# Patient Record
Sex: Female | Born: 1955 | Race: White | Hispanic: No | Marital: Single | Smoking: Current every day smoker
Health system: Southern US, Community
[De-identification: ages and names within clinical notes are randomized; demographics above are authoritative.]

## PROBLEM LIST (undated history)

## (undated) DIAGNOSIS — Z87898 Personal history of other specified conditions: Secondary | ICD-10-CM

## (undated) HISTORY — DX: Personal history of other specified conditions: Z87.898

## (undated) HISTORY — PX: ROTATOR CUFF REPAIR: SHX139

## (undated) HISTORY — PX: TOTAL HIP ARTHROPLASTY: SHX124

## (undated) HISTORY — PX: WRIST FRACTURE SURGERY: SHX121

---

## 2015-06-10 ENCOUNTER — Emergency Department: Payer: BLUE CROSS/BLUE SHIELD

## 2015-06-10 ENCOUNTER — Emergency Department (INDEPENDENT_AMBULATORY_CARE_PROVIDER_SITE_OTHER): Payer: BLUE CROSS/BLUE SHIELD

## 2015-06-10 ENCOUNTER — Encounter: Payer: Self-pay | Admitting: *Deleted

## 2015-06-10 ENCOUNTER — Ambulatory Visit (INDEPENDENT_AMBULATORY_CARE_PROVIDER_SITE_OTHER): Payer: BLUE CROSS/BLUE SHIELD | Admitting: Family Medicine

## 2015-06-10 ENCOUNTER — Emergency Department (INDEPENDENT_AMBULATORY_CARE_PROVIDER_SITE_OTHER)
Admission: EM | Admit: 2015-06-10 | Discharge: 2015-06-10 | Disposition: A | Discharge status: Home / Self Care | Payer: BLUE CROSS/BLUE SHIELD | Source: Home / Self Care | Attending: Family Medicine | Admitting: Family Medicine

## 2015-06-10 DIAGNOSIS — M25562 Pain in left knee: Secondary | ICD-10-CM | POA: Diagnosis not present

## 2015-06-10 DIAGNOSIS — M7122 Synovial cyst of popliteal space [Baker], left knee: Secondary | ICD-10-CM

## 2015-06-10 MED ORDER — TRAMADOL HCL 50 MG PO TABS: 50.0000 mg | ORAL_TABLET | Freq: Three times a day (TID) | ORAL | Status: AC | PRN

## 2015-06-10 NOTE — ED Notes (Addendum)
Pt to return for US venous doppler at 11:30am in [place]. Pt will return home now to elevate her leg per Dr Cathren Harsh request until her appt.  Clemens Catholic, LPN

## 2015-06-10 NOTE — ED Notes (Signed)
Pt c/o pain and swelling to the back of her LT calf x 2 wks. Denies injury. She has taken IBF and Goody powders without relief.

## 2015-06-10 NOTE — ED Provider Notes (Signed)
CSN: [id number]     Arrival date & time 06/10/15  7829 History   First MD Initiated Contact with Patient 06/10/15 [phone]     Chief Complaint  Patient presents with  . Leg Pain      HPI Comments: Patient complains of onset of pain in her left posterior calf about two weeks ago.  The pain has gradually extended upwards to involve her knee and lower left posterior thigh.  She recalls no injury or change in physical activities.  No chest pain or shortness of breath.  She smokes one pack per day cigarettes. No history of DVT.  Patient is a 60 y.o. female presenting with leg pain. The history is provided by the patient.  Leg Pain Location:  Leg Time since incident:  2 weeks Injury: no   Leg location:  L lower leg and L upper leg Pain details:    Quality:  Aching   Radiates to:  Does not radiate   Severity:  Moderate   Onset quality:  Gradual   Duration:  2 weeks   Timing:  Constant   Progression:  Worsening Chronicity:  New Prior injury to area:  No Relieved by:  Nothing Worsened by:  Bearing weight and flexion Ineffective treatments:  NSAIDs Associated symptoms: decreased ROM, stiffness and swelling   Associated symptoms: no fever, no muscle weakness, no numbness and no tingling     History reviewed. No pertinent past medical history. Past Surgical History  Procedure Laterality Date  . Wrist fracture surgery    . Rotator cuff repair    . Total hip arthroplasty     History reviewed. No pertinent family history. Social History  Substance Use Topics  . Smoking status: Current Every Day Smoker -- 1.00 packs/day    Types: Cigarettes  . Smokeless tobacco: None  . Alcohol Use: Yes     Comment: 6 q wk   OB History    No data available     Review of Systems  Constitutional: Negative for fever.  Respiratory: Negative for chest tightness, shortness of breath and wheezing.   Cardiovascular: Negative for chest pain.  Musculoskeletal: Positive for stiffness.  All other systems  reviewed and are negative.   Allergies  Review of patient's allergies indicates no known allergies.  Home Medications   Prior to Admission medications   Not on File   Meds Ordered and Administered this Visit  Medications - No data to display  BP 153/88 mmHg  Pulse 91  Temp(Src) 98 F (36.7 C) (Oral)  Resp 16  Ht  (1.575 m)  Wt 165 lb (74.844 kg)  BMI 30.17 kg/m2  SpO2 96% No data found.   Physical Exam  Constitutional: She is oriented to person, place, and time. She appears well-developed and well-nourished. No distress.  HENT:  Head: Normocephalic.  Nose: Nose normal.  Mouth/Throat: Oropharynx is clear and moist.  Eyes: Conjunctivae are normal. Pupils are equal, round, and reactive to light.  Neck: Neck supple.  Cardiovascular: Normal rate and normal heart sounds.   Pulmonary/Chest: Effort normal and breath sounds normal. She has no wheezes.  Abdominal: Bowel sounds are normal. There is no tenderness.  Musculoskeletal: She exhibits tenderness. She exhibits no edema.       Left knee: She exhibits decreased range of motion, swelling, effusion and bony tenderness. She exhibits no ecchymosis, no deformity, no erythema, normal alignment, no LCL laxity, normal patellar mobility, normal meniscus and no MCL laxity. Tenderness found. Medial joint line and  lateral joint line tenderness noted. No patellar tendon tenderness noted.       Left lower leg: She exhibits tenderness. She exhibits no bony tenderness, no swelling and no edema.       Legs: There is tenderness to palpation left posterior leg as noted on diagram.  Pedal pulses are intact.   Left knee:  No erythema or warmth.  Effusion present.  Knee stable, negative drawer test.  McMurray test negative.  There is tenderness to palpation over medial and lateral joint lines.  There is fullness and distinct tenderness to palpation in the popliteal fossa.  Lymphadenopathy:    She has no cervical adenopathy.  Neurological: She  is alert and oriented to person, place, and time.  Skin: Skin is warm and dry. No rash noted.  Nursing note and vitals reviewed.   ED Course  Procedures  None  Imaging Review US Venous Img Lower Unilateral Left  06/10/2015  CLINICAL DATA:  60 year old female with a history of left lower leg tenderness and swelling for 2 weeks EXAM: LEFT LOWER EXTREMITY VENOUS DOPPLER ULTRASOUND TECHNIQUE: Gray-scale sonography with graded compression, as well as color Doppler and duplex ultrasound were performed to evaluate the lower extremity deep venous systems from the level of the common femoral vein and including the common femoral, femoral, profunda femoral, popliteal and calf veins including the posterior tibial, peroneal and gastrocnemius veins when visible. The superficial great saphenous vein was also interrogated. Spectral Doppler was utilized to evaluate flow at rest and with distal augmentation maneuvers in the common femoral, femoral and popliteal veins. COMPARISON:  None. FINDINGS: Contralateral Common Femoral Vein: Respiratory phasicity is normal and symmetric with the symptomatic side. No evidence of thrombus. Normal compressibility. Common Femoral Vein: No evidence of thrombus. Normal compressibility, respiratory phasicity and response to augmentation. Saphenofemoral Junction: No evidence of thrombus. Normal compressibility and flow on color Doppler imaging. Profunda Femoral Vein: No evidence of thrombus. Normal compressibility and flow on color Doppler imaging. Femoral Vein: No evidence of thrombus. Normal compressibility, respiratory phasicity and response to augmentation. Popliteal Vein: No evidence of thrombus. Normal compressibility, respiratory phasicity and response to augmentation. Calf Veins: No evidence of thrombus. Normal compressibility and flow on color Doppler imaging. Superficial Great Saphenous Vein: No evidence of thrombus. Normal compressibility and flow on color Doppler imaging. Other  Findings:  None. IMPRESSION: Sonographic survey of the left lower extremity negative for DVT. Signed, Yvone Neu. Loreta Ave, DO [facility] Electronically Signed   By: Gilmer Mor D.O.   On: 06/10/2015 12:01   Dg Knee Complete 4 Views Left  06/10/2015  CLINICAL DATA:  Persistent left knee pain for 2 weeks. EXAM: LEFT KNEE - COMPLETE 4+ VIEW COMPARISON:  None. FINDINGS: Large joint effusion without fracture or subluxation. Mild marginal spurring without joint narrowing. No evidence of focal bone lesion or erosion. There is a 7 mm ossified body in the posterior medial joint line. Given distance from the joint this is likely in a Baker cyst. IMPRESSION: 1. Large joint effusion without acute osseous finding. 2. Suspect Baker cyst with intra-articular body. Electronically Signed   By: Marnee Spring M.D.   On: 06/10/2015 12:57      MDM   1. Left knee pain; no evidence DVT.  Note large joint effusion   2. Baker's cyst, left.  Note presence of intra-articular body, most likely in Baker's cyst.    Will arrange consultation with Dr. Clementeen Graham for further evaluation and management.  Lattie Haw, MD 06/10/15 1322

## 2015-06-10 NOTE — Discharge Instructions (Signed)
Baker Cyst °A Baker cyst is a sac-like structure that forms in the back of the knee. It is filled with the same fluid that is located in your knee. This fluid lubricates the bones and cartilage of the knee and allows them to move over each other more easily. °CAUSES  °When the knee becomes injured or inflamed, increased fluid forms in the knee. When this happens, the joint lining is pushed out behind the knee and forms the Baker cyst. This cyst may also be caused by inflammation from arthritic conditions and infections. °SIGNS AND SYMPTOMS  °A Baker cyst usually has no symptoms. When the cyst is substantially enlarged: °· You may feel pressure behind the knee, stiffness in the knee, or a mass in the area behind the knee. °· You may develop pain, redness, and swelling in the calf.  This can suggest a blood clot and requires evaluation by your health care provider. °DIAGNOSIS  °A Baker cyst is most often found during an ultrasound exam. This exam may have been performed for other reasons, and the cyst was found incidentally. Sometimes an MRI is used. This picks up other problems within a joint that an ultrasound exam may not. If the Baker cyst developed immediately after an injury, X-ray exams may be used to diagnose the cyst. °TREATMENT  °The treatment depends on the cause of the cyst. Anti-inflammatory medicines and rest often will be prescribed. If the cyst is caused by a bacterial infection, antibiotic medicines may be prescribed.  °HOME CARE INSTRUCTIONS  °· If the cyst was caused by an injury, for the first 24 hours, keep the injured leg elevated on 2 pillows while lying down. °· For the first 24 hours while you are awake, apply ice to the injured area: °¨ Put ice in a plastic bag. °¨ Place a towel between your skin and the bag. °¨ Leave the ice on for 20 minutes, 2-3 times a day. °· Only take over-the-counter or prescription medicines for pain, discomfort, or fever as directed by your health care  provider. °· Only take antibiotic medicine as directed. Make sure to finish it even if you start to feel better. °MAKE SURE YOU:  °· Understand these instructions. °· Will watch your condition. °· Will get help right away if you are not doing well or get worse. °  °This information is not intended to replace advice given to you by your health care provider. Make sure you discuss any questions you have with your health care provider. °  °Document Released: 04/13/2005 Document Revised: 02/01/2013 Document Reviewed: 11/23/2012 °Elsevier Interactive Patient Education ©2016 [employer] ° °

## 2015-06-10 NOTE — Assessment & Plan Note (Signed)
Effusion and loose body. I'm not sure how the loose body relates to patient's knee pain and swelling. She denies any mechanical symptoms. Trial of injection today. Treat with tramadol additionally. Culture cell differential count and crystal analysis pending of knee fluid. Follow-up in 1-3 weeks.

## 2015-06-10 NOTE — Progress Notes (Signed)
Subjective:    I'm seeing this patient as a consultation for:  Dr Cathren Harsh  CC: Left knee pain and swelling  HPI: Left knee pain and swelling for about 2 weeks with no specific injury. No locking catching or giving way. Pain is worse with activity and better with rest. No radiating pain weakness or numbness fevers or chills. Patient was seen by urgent care today who diagnosed knee effusion and loose body. Patient is interested in aspiration and injection.  Past medical history, Surgical history, Family history not pertinant except as noted below, Social history, Allergies, and medications have been entered into the medical record, reviewed, and no changes needed.   Review of Systems: No headache, visual changes, nausea, vomiting, diarrhea, constipation, dizziness, abdominal pain, skin rash, fevers, chills, night sweats, weight loss, swollen lymph nodes, body aches, joint swelling, muscle aches, chest pain, shortness of breath, mood changes, visual or auditory hallucinations.   Objective:   BP 153/88 mmHg  Pulse 91  Temp(Src) 98 F (36.7 C) (Oral)  Resp 16  Ht  (1.575 m)  Wt 165 lb (74.844 kg)  BMI 30.17 kg/m2  SpO2 96% General: Well Developed, well nourished, and in no acute distress.  Neuro/Psych: Alert and oriented x3, extra-ocular muscles intact, able to move all 4 extremities, sensation grossly intact. Skin: Warm and dry, no rashes noted.  Respiratory: Not using accessory muscles, speaking in full sentences, trachea midline.  Cardiovascular: Pulses palpable, no extremity edema. Abdomen: Does not appear distended. MSK: Left knee with moderate effusion. No ecchymosis. Range of motion 0-120 with 1+ retropatellar crepitations. Nontender joint line. Stable ligamentous exam testing.  Procedure: Real-time Ultrasound Guided aspiration and injection of left knee  Device: GE Logiq E  Images permanently stored and available for review in the ultrasound unit. Verbal informed consent  obtained. Discussed risks and benefits of procedure. Warned about infection bleeding damage to structures skin hypopigmentation and fat atrophy among others. Patient expresses understanding and agreement Time-out conducted.  Noted no overlying erythema, induration, or other signs of local infection.  Skin prepped in a sterile fashion.  Local anesthesia: Topical Ethyl chloride.  3 mL of lidocaine without epinephrine injected subcutaneously achieving good anesthesia along the planned aspiration tract With sterile technique and under real time ultrasound guidance: 18 gauge needle under ultrasound guidance used to access the suprapatellar space. 25 mL of cloudy fluid removed. Syringe exchanged and 40 mg of Kenalog and 4 ml of Marcaine injected easily.  Completed without difficulty  Pain immediately resolved suggesting accurate placement of the medication.  Advised to call if fevers/chills, erythema, induration, drainage, or persistent bleeding.  Images permanently stored and available for review in the ultrasound unit.  Impression: Technically successful ultrasound guided injection.    No results found for this or any previous visit (from the past 24 hour(s)). US Venous Img Lower Unilateral Left  06/10/2015  CLINICAL DATA:  59 year old female with a history of left lower leg tenderness and swelling for 2 weeks EXAM: LEFT LOWER EXTREMITY VENOUS DOPPLER ULTRASOUND TECHNIQUE: Gray-scale sonography with graded compression, as well as color Doppler and duplex ultrasound were performed to evaluate the lower extremity deep venous systems from the level of the common femoral vein and including the common femoral, femoral, profunda femoral, popliteal and calf veins including the posterior tibial, peroneal and gastrocnemius veins when visible. The superficial great saphenous vein was also interrogated. Spectral Doppler was utilized to evaluate flow at rest and with distal augmentation maneuvers in the  common femoral,  femoral and popliteal veins. COMPARISON:  None. FINDINGS: Contralateral Common Femoral Vein: Respiratory phasicity is normal and symmetric with the symptomatic side. No evidence of thrombus. Normal compressibility. Common Femoral Vein: No evidence of thrombus. Normal compressibility, respiratory phasicity and response to augmentation. Saphenofemoral Junction: No evidence of thrombus. Normal compressibility and flow on color Doppler imaging. Profunda Femoral Vein: No evidence of thrombus. Normal compressibility and flow on color Doppler imaging. Femoral Vein: No evidence of thrombus. Normal compressibility, respiratory phasicity and response to augmentation. Popliteal Vein: No evidence of thrombus. Normal compressibility, respiratory phasicity and response to augmentation. Calf Veins: No evidence of thrombus. Normal compressibility and flow on color Doppler imaging. Superficial Great Saphenous Vein: No evidence of thrombus. Normal compressibility and flow on color Doppler imaging. Other Findings:  None. IMPRESSION: Sonographic survey of the left lower extremity negative for DVT. Signed, Yvone Neu. Loreta Ave, DO [facility] Electronically Signed   By: Gilmer Mor D.O.   On: 06/10/2015 12:01   Dg Knee Complete 4 Views Left  06/10/2015  CLINICAL DATA:  Persistent left knee pain for 2 weeks. EXAM: LEFT KNEE - COMPLETE 4+ VIEW COMPARISON:  None. FINDINGS: Large joint effusion without fracture or subluxation. Mild marginal spurring without joint narrowing. No evidence of focal bone lesion or erosion. There is a 7 mm ossified body in the posterior medial joint line. Given distance from the joint this is likely in a Baker cyst. IMPRESSION: 1. Large joint effusion without acute osseous finding. 2. Suspect Baker cyst with intra-articular body. Electronically Signed   By: Marnee Spring M.D.   On: 06/10/2015 12:57    Impression and Recommendations:     This case required medical decision making of moderate complexity.

## 2015-06-10 NOTE — Patient Instructions (Signed)
Thank you for coming in today. Call or go to the ER if you develop a large red swollen joint with extreme pain or oozing puss.  Return in 1-3 weeks for follow up.

## 2015-06-11 LAB — SYNOVIAL CELL COUNT + DIFF, W/ CRYSTALS
Crystals, Fluid: NONE SEEN
Eosinophils-Synovial: 0 % (ref 0–1)
LYMPHOCYTES-SYNOVIAL FLD: 9 % (ref 0–20)
MONOCYTE/MACROPHAGE: 45 % — AB (ref 50–90)
Neutrophil, Synovial: 46 % — ABNORMAL HIGH (ref 0–25)
WBC, SYNOVIAL: 145 uL (ref 0–200)

## 2015-06-12 NOTE — Progress Notes (Signed)
Quick Note:  Knee fluid is normal appearing so far. Awaiting culture results. ______

## 2015-08-06 DIAGNOSIS — M1712 Unilateral primary osteoarthritis, left knee: Secondary | ICD-10-CM | POA: Diagnosis not present

## 2015-09-05 DIAGNOSIS — M1712 Unilateral primary osteoarthritis, left knee: Secondary | ICD-10-CM | POA: Diagnosis not present

## 2015-11-01 DIAGNOSIS — Z862 Personal history of diseases of the blood and blood-forming organs and certain disorders involving the immune mechanism: Secondary | ICD-10-CM | POA: Diagnosis not present

## 2015-11-01 DIAGNOSIS — R7302 Impaired glucose tolerance (oral): Secondary | ICD-10-CM | POA: Diagnosis not present

## 2015-11-01 DIAGNOSIS — J019 Acute sinusitis, unspecified: Secondary | ICD-10-CM | POA: Diagnosis not present

## 2015-11-12 DIAGNOSIS — R05 Cough: Secondary | ICD-10-CM | POA: Diagnosis not present

## 2015-11-12 DIAGNOSIS — J329 Chronic sinusitis, unspecified: Secondary | ICD-10-CM | POA: Diagnosis not present

## 2015-11-12 DIAGNOSIS — K219 Gastro-esophageal reflux disease without esophagitis: Secondary | ICD-10-CM | POA: Diagnosis not present

## 2015-11-12 DIAGNOSIS — J40 Bronchitis, not specified as acute or chronic: Secondary | ICD-10-CM | POA: Diagnosis not present

## 2016-11-04 ENCOUNTER — Encounter: Payer: Self-pay | Admitting: Osteopathic Medicine

## 2016-11-04 ENCOUNTER — Ambulatory Visit (INDEPENDENT_AMBULATORY_CARE_PROVIDER_SITE_OTHER): Payer: BLUE CROSS/BLUE SHIELD | Admitting: Osteopathic Medicine

## 2016-11-04 VITALS — BP 145/57 | HR 77 | Wt 171.0 lb

## 2016-11-04 DIAGNOSIS — Z87898 Personal history of other specified conditions: Secondary | ICD-10-CM

## 2016-11-04 DIAGNOSIS — M25531 Pain in right wrist: Secondary | ICD-10-CM | POA: Diagnosis not present

## 2016-11-04 DIAGNOSIS — R42 Dizziness and giddiness: Secondary | ICD-10-CM

## 2016-11-04 LAB — EKG 12-LEAD

## 2016-11-04 MED ORDER — MELOXICAM 15 MG PO TABS: 15.0000 mg | ORAL_TABLET | Freq: Every day | ORAL | 0 refills | Status: DC

## 2016-11-04 NOTE — Patient Instructions (Addendum)
Plan: Wear wrist brace as much as possible during the day - take antiinflammatories x1 week daily then as needed, follow-up with Dr Denyse Amassorey if no better  Will plan to come see me in 1-2 weeks to recheck symptoms and review labs in detail    Orthostatic Hypotension Orthostatic hypotension is a sudden drop in blood pressure that happens when you quickly change positions, such as when you get up from a seated or lying position. Blood pressure is a measurement of how strongly, or weakly, your blood is pressing against the walls of your arteries. Arteries are blood vessels that carry blood from your heart throughout your body. When blood pressure is too low, you may not get enough blood to your brain or to the rest of your organs. This can cause weakness, light-headedness, rapid heartbeat, and fainting. This can last for just a few seconds or for up to a few minutes. Orthostatic hypotension is usually not a serious problem. However, if it happens frequently or gets worse, it may be a sign of something more serious. What are the causes? This condition may be caused by:  Sudden changes in posture, such as standing up quickly after you have been sitting or lying down.  Blood loss.  Loss of body fluids (dehydration).  Heart problems.  Hormone (endocrine) problems.  Pregnancy.  Severe infection.  Lack of certain nutrients.  Severe allergic reactions (anaphylaxis).  Certain medicines, such as blood pressure medicine or medicines that make the body lose excess fluids (diuretics). Sometimes, this condition can be caused by not taking medicine as directed, such as taking too much of a certain medicine.  What increases the risk? Certain factors can make you more likely to develop orthostatic hypotension, including:  Age. Risk increases as you get older.  Conditions that affect the heart or the central nervous system.  Taking certain medicines, such as blood pressure medicine or  diuretics.  Being pregnant.  What are the signs or symptoms? Symptoms of this condition may include:  Weakness.  Light-headedness.  Dizziness.  Blurred vision.  Fatigue.  Rapid heartbeat.  Fainting, in severe cases.  How is this diagnosed? This condition is diagnosed based on:  Your medical history.  Your symptoms.  Your blood pressure measurement. Your health care provider will check your blood pressure when you are: ? Lying down. ? Sitting. ? Standing.  A blood pressure reading is recorded as two numbers, such as "120 over 80" (or 120/80). The first ("top") number is called the systolic pressure. It is a measure of the pressure in your arteries as your heart beats. The second ("bottom") number is called the diastolic pressure. It is a measure of the pressure in your arteries when your heart relaxes between beats. Blood pressure is measured in a unit called mm Hg. Healthy blood pressure for adults is 120/80. If your blood pressure is below 90/60, you may be diagnosed with hypotension. Other information or tests that may be used to diagnose orthostatic hypotension include:  Your other vital signs, such as your heart rate and temperature.  Blood tests.  Tilt table test. For this test, you will be safely secured to a table that moves you from a lying position to an upright position. Your heart rhythm and blood pressure will be monitored during the test.  How is this treated? Treatment for this condition may include:  Changing your diet. This may involve eating more salt (sodium) or drinking more water.  Taking medicines to raise your blood pressure.  Changing the dosage of certain medicines you are taking that might be lowering your blood pressure.  Wearing compression stockings. These stockings help to prevent blood clots and reduce swelling in your legs.  In some cases, you may need to go to the hospital for:  Fluid replacement. This means you will receive  fluids through an IV tube.  Blood replacement. This means you will receive donated blood through an IV tube (transfusion).  Treating an infection or heart problems, if this applies.  Monitoring. You may need to be monitored while medicines that you are taking wear off.  Follow these instructions at home: Eating and drinking   Drink enough fluid to keep your urine clear or pale yellow.  Eat a healthy diet and follow instructions from your health care provider about eating or drinking restrictions. A healthy diet includes: ? Fresh fruits and vegetables. ? Whole grains. ? Lean meats. ? Low-fat dairy products.  Eat extra salt only as directed. Do not add extra salt to your diet unless your health care provider told you to do that.  Eat frequent, small meals.  Avoid standing up suddenly after eating. Medicines  Take over-the-counter and prescription medicines only as told by your health care provider. ? Follow instructions from your health care provider about changing the dosage of your current medicines, if this applies. ? Do not stop or adjust any of your medicines on your own. General instructions  Wear compression stockings as told by your health care provider.  Get up slowly from lying down or sitting positions. This gives your blood pressure a chance to adjust.  Avoid hot showers and excessive heat as directed by your health care provider.  Return to your normal activities as told by your health care provider. Ask your health care provider what activities are safe for you.  Do not use any products that contain nicotine or tobacco, such as cigarettes and e-cigarettes. If you need help quitting, ask your health care provider.  Keep all follow-up visits as told by your health care provider. This is important. Contact a health care provider if:  You vomit.  You have diarrhea.  You have a fever for more than 2-3 days.  You feel more thirsty than usual.  You feel weak  and tired. Get help right away if:  You have chest pain.  You have a fast or irregular heartbeat.  You develop numbness in any part of your body.  You cannot move your arms or your legs.  You have trouble speaking.  You become sweaty or feel lightheaded.  You faint.  You feel short of breath.  You have trouble staying awake.  You feel confused. This information is not intended to replace advice given to you by your health care provider. Make sure you discuss any questions you have with your health care provider. Document Released: 04/03/2002 Document Revised: 12/31/2015 Document Reviewed: 10/04/2015 Elsevier Interactive Patient Education  2018 [employer]

## 2016-11-05 DIAGNOSIS — Z87898 Personal history of other specified conditions: Secondary | ICD-10-CM | POA: Insufficient documentation

## 2016-11-05 DIAGNOSIS — R42 Dizziness and giddiness: Secondary | ICD-10-CM | POA: Insufficient documentation

## 2016-11-05 DIAGNOSIS — M25531 Pain in right wrist: Secondary | ICD-10-CM | POA: Insufficient documentation

## 2016-11-05 LAB — CBC
HCT: 46.2 % — ABNORMAL HIGH (ref 35.0–45.0)
Hemoglobin: 15.3 g/dL (ref 11.7–15.5)
MCH: 30.8 pg (ref 27.0–33.0)
MCHC: 33.1 g/dL (ref 32.0–36.0)
MCV: 93.1 fL (ref 80.0–100.0)
MPV: 10.6 fL (ref 7.5–12.5)
PLATELETS: 241 10*3/uL (ref 140–400)
RBC: 4.96 MIL/uL (ref 3.80–5.10)
RDW: 14.1 % (ref 11.0–15.0)
WBC: 6.7 10*3/uL (ref 3.8–10.8)

## 2016-11-05 LAB — TSH: TSH: 0.53 mIU/L

## 2016-11-05 LAB — COMPLETE METABOLIC PANEL WITH GFR
ALT: 12 U/L (ref 6–29)
AST: 12 U/L (ref 10–35)
Albumin: 3.8 g/dL (ref 3.6–5.1)
Alkaline Phosphatase: 30 U/L — ABNORMAL LOW (ref 33–130)
BILIRUBIN TOTAL: 0.5 mg/dL (ref 0.2–1.2)
BUN: 15 mg/dL (ref 7–25)
CHLORIDE: 106 mmol/L (ref 98–110)
CO2: 25 mmol/L (ref 20–31)
Calcium: 9.1 mg/dL (ref 8.6–10.4)
Creat: 0.69 mg/dL (ref 0.50–1.05)
GLUCOSE: 75 mg/dL (ref 65–99)
Potassium: 4.7 mmol/L (ref 3.5–5.3)
SODIUM: 139 mmol/L (ref 135–146)
TOTAL PROTEIN: 6 g/dL — AB (ref 6.1–8.1)

## 2016-11-05 NOTE — Progress Notes (Signed)
HPI: Mackenzie Wilson is a 61 y.o. female  who presents to [facility] today, 11/05/16,  for chief complaint of:  Chief Complaint  Patient presents with  . Establish Care    New patient here to establish care. Has been several years since routine labs are checkup. She is concerned about sugar levels. She states that she has been feeling a bit more jittery lately. History of prediabetes.  Lightheadedness: Occasionally bothersome, only happens when changing position from seated to standing. She had one episode of syncope with this see months ago, nothing before or since. No palpitations, no chest pain, no trouble breathing. No vision change, no history of migraine or seizure.  Right wrist pain: Associated with some numbness/tingling in digits 2, 3, 4. Some pain with thumb movement. Does not radiate up the arm. Reports some decreased grip strength on right hand compared to left. Has been wearing carpal tunnel brace but this seems to fall off at night. Only wearing it at night.   Past medical, surgical, social and family history reviewed: Patient Active Problem List   Diagnosis Date Noted  . Left knee pain 06/10/2015   Past Surgical History:  Procedure Laterality Date  . ROTATOR CUFF REPAIR    . TOTAL HIP ARTHROPLASTY    . WRIST FRACTURE SURGERY     Social History  Substance Use Topics  . Smoking status: Current Every Day Smoker    Packs/day: 1.00    Types: Cigarettes  . Smokeless tobacco: Never Used  . Alcohol use Yes     Comment: 6 q wk   No family history on file.   Current medication list and allergy/intolerance information reviewed:   Current Outpatient Prescriptions  Medication Sig Dispense Refill  . Calcium Carb-Cholecalciferol (CALCIUM 1000 + D PO) Take by mouth.    . meloxicam (MOBIC) 15 MG tablet Take 1 tablet (15 mg total) by mouth daily. 30 tablet 0  . traMADol (ULTRAM) 50 MG tablet Take 1 tablet (50 mg total) by mouth every 8 (eight)  hours as needed. 30 tablet 0   No current facility-administered medications for this visit.    No Known Allergies    Review of Systems:  Constitutional:  No  fever, no chills, No recent illness, No unintentional weight changes. No significant fatigue.   HEENT: +occasional headache, no vision change, no hearing change, No sore throat, No  sinus pressure  Cardiac: No  chest pain, No  pressure, No palpitations, No  Orthopnea  Respiratory:  No  shortness of breath. No  Cough  Gastrointestinal: No  abdominal pain, No  nausea, No  vomiting,  No  blood in stool, No  diarrhea, No  constipation   Musculoskeletal: No new myalgia/arthralgia  Genitourinary: No  incontinence, No  abnormal genital bleeding, No abnormal genital discharge  Skin: No  Rash, No other wounds/concerning lesions  Endocrine: No cold intolerance,  No heat intolerance. No polyuria/polydipsia/polyphagia   Neurologic: No  weakness, No  dizziness, No  slurred speech/focal weakness/facial droop  Psychiatric: No  concerns with depression, No  concerns with anxiety, No sleep problems, No mood problems  Exam:  BP (!) 145/57   Pulse 77   Wt 171 lb (77.6 kg)   BMI 31.28 kg/m   Constitutional: VS see above. General Appearance: alert, well-developed, well-nourished, NAD  Eyes: Normal lids and conjunctive, non-icteric sclera  Ears, Nose, Mouth, Throat: MMM, Normal external inspection ears/nares/mouth/lips/gums.   Neck: No masses, trachea midline. No thyroid enlargement. No  tenderness/mass appreciated. No lymphadenopathy  Respiratory: Normal respiratory effort. no wheeze, no rhonchi, no rales  Cardiovascular: S1/S2 normal, no murmur, no rub/gallop auscultated. RRR. No lower extremity edema.  Musculoskeletal: Gait normal. No clubbing/cyanosis of digits. Neg tinel's/phalens on R but some decreased grip strength ?pain vs weakness  Neurological: Normal balance/coordination. No tremor.   Skin: warm, dry,  intact.  Psychiatric: Normal judgment/insight. Normal mood and affect. Oriented x3.   No data found.    ASSESSMENT/PLAN:   Orthostatic lightheaded - Orthostatic vital signs show drop systolic blood pressure 10 points from lying down to standing. No concerning cardio/neuro symptoms but consider further w/u with echocardiogram, carotid ultrasound, consider CT versus MRI. Additional patient information printed.- Plan: CBC, COMPLETE METABOLIC PANEL WITH GFR, TSH, Hemoglobin A1c, VITAMIN D 25 Hydroxy (Vit-D Deficiency, Fractures), Vitamin B12, Urinalysis, Routine w reflex microscopic  Right wrist pain - We'll try some spica splint for immobilization of thumb as well since this seems to be bothering her, consider follow-up with sports medicine   History of prediabetes - Plan: CBC, COMPLETE METABOLIC PANEL WITH GFR, Hemoglobin A1c    Patient Instructions  Plan: Wear wrist brace as much as possible during the day - take antiinflammatories x1 week daily then as needed, follow-up with Dr Denyse Amassorey if no better  Will plan to come see me in 1-2 weeks to recheck symptoms and review labs in detail        Visit summary with medication list and pertinent instructions was printed for patient to review. All questions at time of visit were answered - patient instructed to contact office with any additional concerns. ER/RTC precautions were reviewed with the patient. Follow-up plan: Return for recheck and review labs 1-2 weeks.

## 2016-11-06 LAB — URINALYSIS, ROUTINE W REFLEX MICROSCOPIC
BILIRUBIN URINE: NEGATIVE
Glucose, UA: NEGATIVE
HGB URINE DIPSTICK: NEGATIVE
KETONES UR: NEGATIVE
Leukocytes, UA: NEGATIVE
NITRITE: NEGATIVE
PROTEIN: NEGATIVE
Specific Gravity, Urine: 1.012 (ref 1.001–1.035)
pH: 7.5 (ref 5.0–8.0)

## 2016-11-06 LAB — VITAMIN B12: VITAMIN B 12: 574 pg/mL (ref 200–1100)

## 2016-11-06 LAB — HEMOGLOBIN A1C
HEMOGLOBIN A1C: 5.5 % (ref <5.7)
MEAN PLASMA GLUCOSE: 111 mg/dL

## 2016-11-06 LAB — VITAMIN D 25 HYDROXY (VIT D DEFICIENCY, FRACTURES): VIT D 25 HYDROXY: 30 ng/mL (ref 30–100)

## 2016-11-19 NOTE — Addendum Note (Signed)
Addended by: Baird KayUGLAS, Kalasia Crafton M on: 11/19/2016 11:57 AM   Modules accepted: Orders

## 2017-01-29 IMAGING — US US EXTREM LOW VENOUS*L*
1 series · 13 of 24 positions shown · non-contrast
Comparison: None.

CLINICAL DATA: 59-year-old female with a history of left lower leg
tenderness and swelling for 2 weeks



[Series 1: us extrem low venous*left* · 0.07mm/px · 13 of 28 slices shown]
[im 1/28]
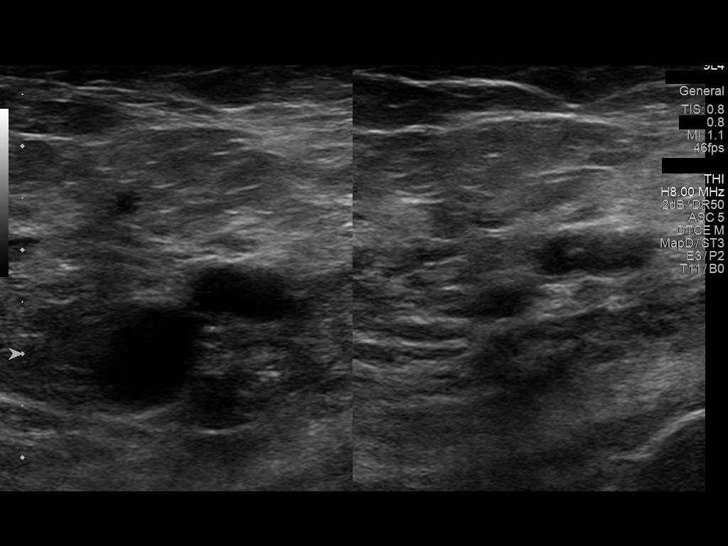
[im 3/28]
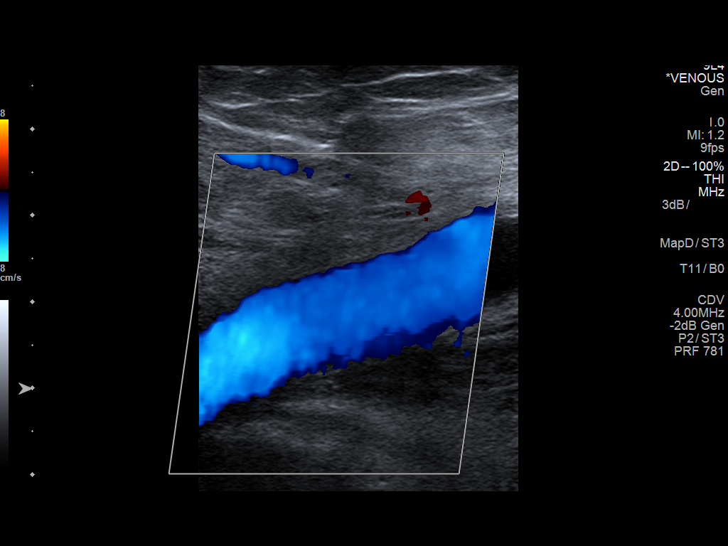
[im 5/28]
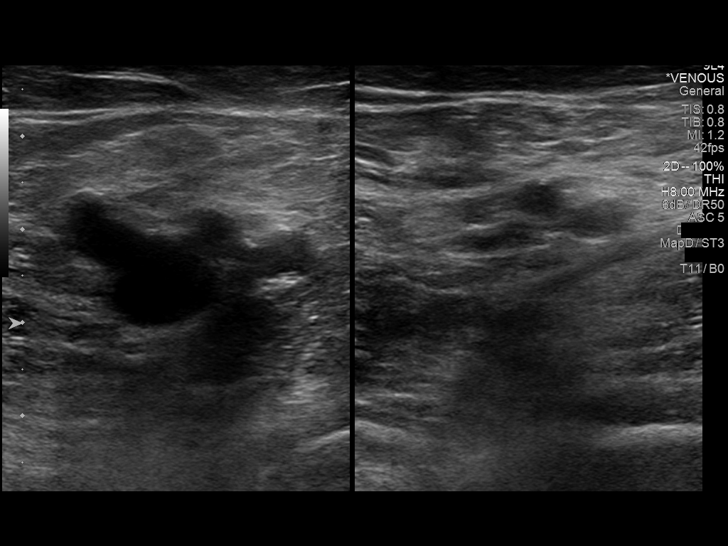
[im 8/28]
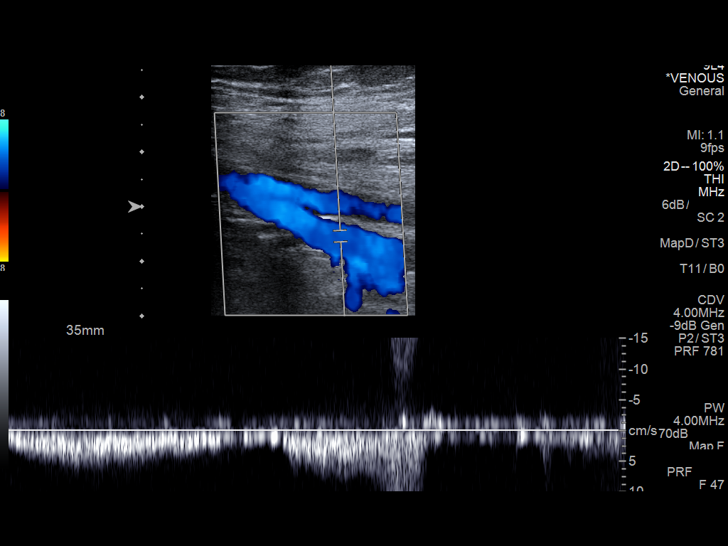
[im 10/28]
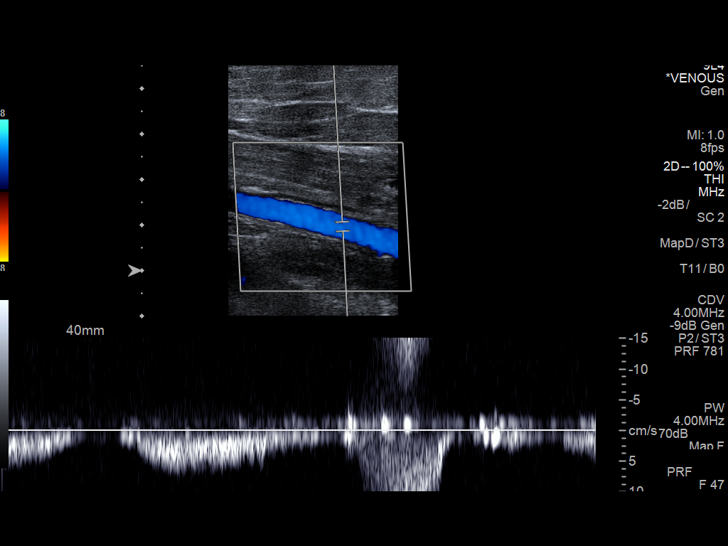
[im 12/28]
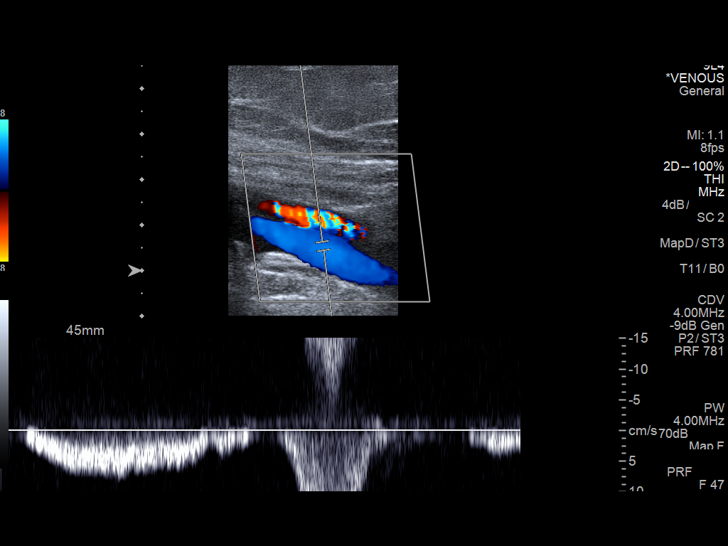
[im 15/28]
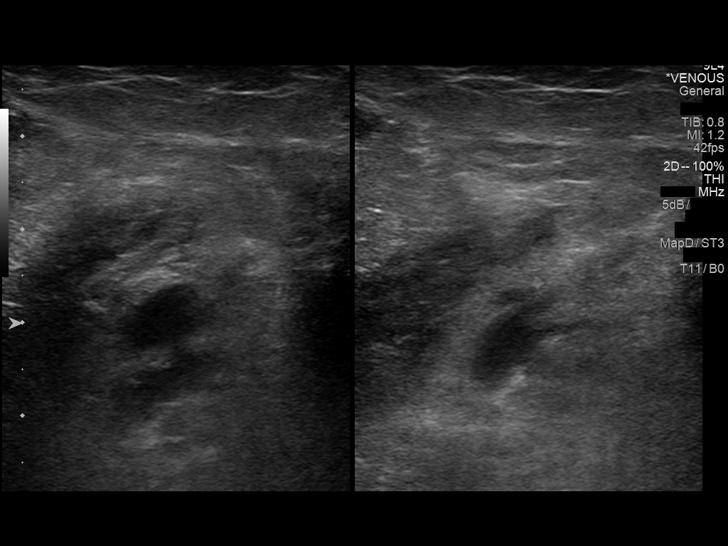
[im 16/28]
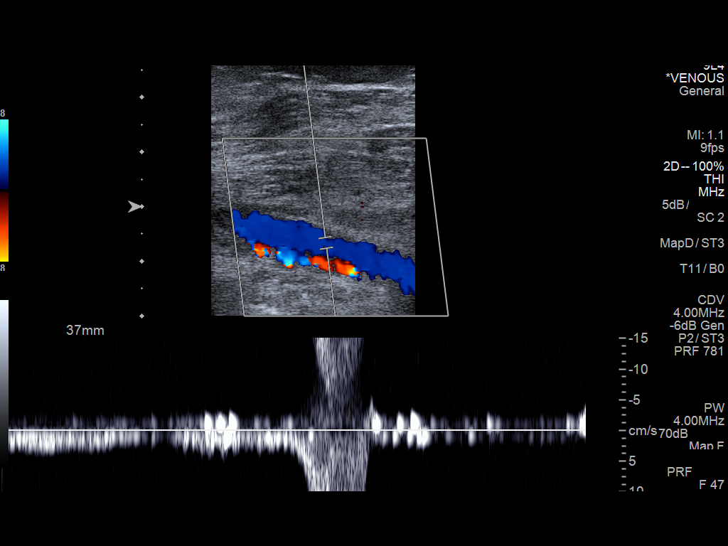
[im 18/28]
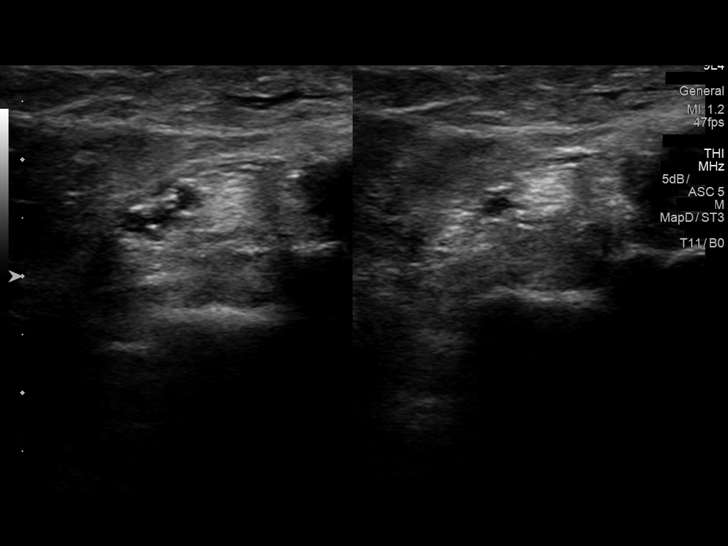
[im 20/28]
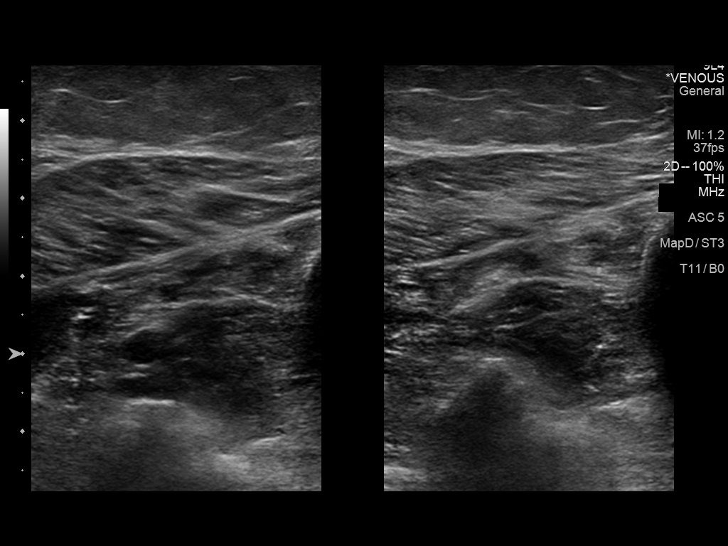
[im 23/28]
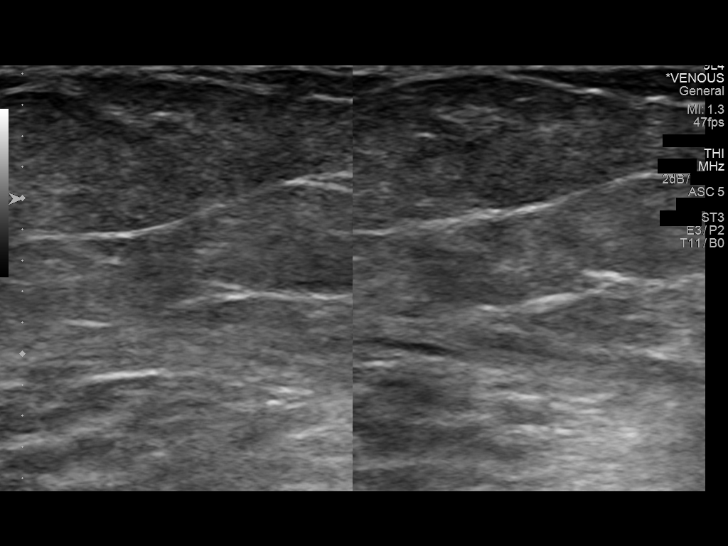
[im 25/28]
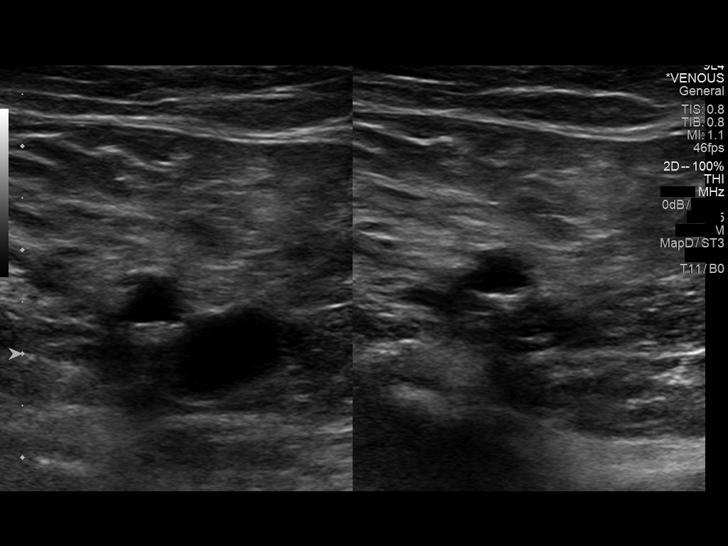
[im 28/28]
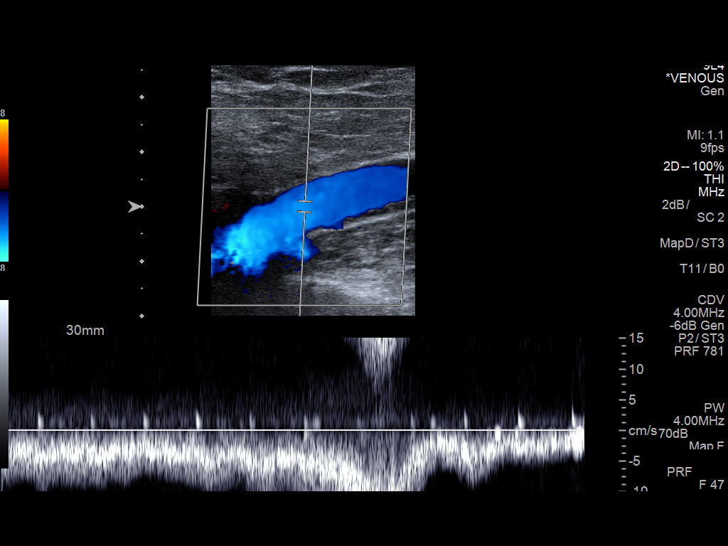

[13 of 24 positions shown; findings below may reference images not displayed]

FINDINGS: Contralateral Common Femoral Vein: Respiratory phasicity is normal
and symmetric with the symptomatic side. No evidence of thrombus.
Normal compressibility.

Common Femoral Vein: No evidence of thrombus. Normal
compressibility, respiratory phasicity and response to augmentation.

Saphenofemoral Junction: No evidence of thrombus. Normal
compressibility and flow on color Doppler imaging.

Profunda Femoral Vein: No evidence of thrombus. Normal
compressibility and flow on color Doppler imaging.

Femoral Vein: No evidence of thrombus. Normal compressibility,
respiratory phasicity and response to augmentation.

Popliteal Vein: No evidence of thrombus. Normal compressibility,
respiratory phasicity and response to augmentation.

Calf Veins: No evidence of thrombus. Normal compressibility and flow
on color Doppler imaging.

Superficial Great Saphenous Vein: No evidence of thrombus. Normal
compressibility and flow on color Doppler imaging.

Other Findings:  None.
IMPRESSION: Sonographic survey of the left lower extremity negative for DVT.

## 2017-03-02 DIAGNOSIS — H527 Unspecified disorder of refraction: Secondary | ICD-10-CM | POA: Diagnosis not present

## 2018-11-15 DIAGNOSIS — R52 Pain, unspecified: Secondary | ICD-10-CM | POA: Diagnosis not present

## 2018-11-15 DIAGNOSIS — Z96641 Presence of right artificial hip joint: Secondary | ICD-10-CM | POA: Diagnosis not present

## 2018-11-15 DIAGNOSIS — M5431 Sciatica, right side: Secondary | ICD-10-CM | POA: Diagnosis not present

## 2019-11-06 ENCOUNTER — Emergency Department (INDEPENDENT_AMBULATORY_CARE_PROVIDER_SITE_OTHER)
Admission: EM | Admit: 2019-11-06 | Discharge: 2019-11-06 | Disposition: A | Discharge status: Home / Self Care | Payer: BC Managed Care – PPO | Source: Home / Self Care

## 2019-11-06 ENCOUNTER — Other Ambulatory Visit: Payer: Self-pay

## 2019-11-06 ENCOUNTER — Emergency Department (INDEPENDENT_AMBULATORY_CARE_PROVIDER_SITE_OTHER): Payer: BC Managed Care – PPO

## 2019-11-06 DIAGNOSIS — M79672 Pain in left foot: Secondary | ICD-10-CM

## 2019-11-06 MED ORDER — MELOXICAM 15 MG PO TABS: 15.0000 mg | ORAL_TABLET | Freq: Every day | ORAL | 0 refills | Status: AC

## 2019-11-06 NOTE — ED Triage Notes (Signed)
Patient presents to Urgent Care with complaints of left foot pain since waking up yesterday morning. Patient reports she has not injured it, woke up w/ it yesterday red and swollen. Slept with it propped up so the redness and swelling has decreased but the pain remains. Pt states it is mostly the top of her foot, no ankle involvement.

## 2019-11-06 NOTE — ED Provider Notes (Signed)
[facility]    CSN: [id number] Arrival date & time: 11/06/19  0803      History   Chief Complaint Chief Complaint  Patient presents with  . Foot Pain    HPI Mackenzie Wilson is a 64 y.o. female.   HPI  Mackenzie Wilson is a 64 y.o. female presenting to UC with c/o Left foot pain, redness and swelling on the top of her foot that she noticed yesterday morning.  Pt unsure if she was bit or stung by anything in the middle of the night but reports waking up with her sheets all disheveled.  No known injury. No hx of sleep walking.  Pain is aching and sore, 9/10, using a walker to help with the pain.  She elevated her foot last night, which did help with the swelling. No pain medication tried PTA.  No hx of gout. No prior fractures or surgeries in same foot.   Past Medical History:  Diagnosis Date  . History of prediabetes     Patient Active Problem List   Diagnosis Date Noted  . Orthostatic lightheadedness 11/05/2016  . Right wrist pain 11/05/2016  . History of prediabetes 11/05/2016  . Left knee pain 06/10/2015    Past Surgical History:  Procedure Laterality Date  . ROTATOR CUFF REPAIR    . TOTAL HIP ARTHROPLASTY    . WRIST FRACTURE SURGERY      OB History   No obstetric history on file.      Home Medications    Prior to Admission medications   Medication Sig Start Date End Date Taking? Authorizing Provider  Calcium Carb-Cholecalciferol (CALCIUM 1000 + D PO) Take by mouth.    [provider]  meloxicam (MOBIC) 15 MG tablet Take 1 tablet (15 mg total) by mouth daily. 11/06/19   Lurene Shadow, PA-C  traMADol (ULTRAM) 50 MG tablet Take 1 tablet (50 mg total) by mouth every 8 (eight) hours as needed. 06/10/15   Rodolph Bong, MD    Family History Family History  Problem Relation Age of Onset  . Hypertension Mother   . COPD Father     Social History Social History   Tobacco Use  . Smoking status: Current Every Day Smoker    Packs/day: 1.00     Types: Cigarettes  . Smokeless tobacco: Never Used  Substance Use Topics  . Alcohol use: Yes    Comment: 6 q wk  . Drug use: No     Allergies   Patient has no known allergies.   Review of Systems Review of Systems  Musculoskeletal: Positive for arthralgias, gait problem and joint swelling.  Skin: Negative for color change and wound.  Neurological: Negative for weakness and numbness.     Physical Exam Triage Vital Signs ED Triage Vitals  Enc Vitals Group     BP 11/06/19 0818 (!) 157/78     Pulse Rate 11/06/19 0818 82     Resp 11/06/19 0818 16     Temp 11/06/19 0818 98.7 F (37.1 C)     Temp Source 11/06/19 0818 Oral     SpO2 11/06/19 0818 99 %     Weight --      Height --      Head Circumference --      Peak Flow --      Pain Score 11/06/19 0817 9     Pain Loc --      Pain Edu? --      Excl. in  GC? --    No data found.  Updated Vital Signs BP (!) 157/78 (BP Location: Right Arm)   Pulse 82   Temp 98.7 F (37.1 C) (Oral)   Resp 16   SpO2 99%   Visual Acuity Right Eye Distance:   Left Eye Distance:   Bilateral Distance:    Right Eye Near:   Left Eye Near:    Bilateral Near:     Physical Exam Vitals and nursing note reviewed.  Constitutional:      Appearance: Normal appearance. She is well-developed.  HENT:     Head: Normocephalic and atraumatic.  Cardiovascular:     Rate and Rhythm: Normal rate and regular rhythm.     Pulses:          Dorsalis pedis pulses are 2+ on the left side.  Pulmonary:     Effort: Pulmonary effort is normal.  Musculoskeletal:        General: Swelling and tenderness present. Normal range of motion.     Cervical back: Normal range of motion.     Comments: Left foot: mild edema and bony prominence to dorsal aspect of mid foot. Tender. Full ROM ankle and toes.   Skin:    General: Skin is warm and dry.     Capillary Refill: Capillary refill takes less than 2 seconds.     Findings: Erythema present.     Comments: Left foot,  dorsal aspect: skin in tact. Faint erythema, no warmth. No induration or fluctuance. No red streaking.  Neurological:     Mental Status: She is alert and oriented to person, place, and time.     Sensory: No sensory deficit.  Psychiatric:        Behavior: Behavior normal.      UC Treatments / Results  Labs (all labs ordered are listed, but only abnormal results are displayed) Labs Reviewed - No data to display  EKG   Radiology DG Foot Complete Left  Result Date: 11/06/2019 CLINICAL DATA:  Acute left foot pain and swelling. EXAM: LEFT FOOT - COMPLETE 3+ VIEW COMPARISON:  None. FINDINGS: There is no evidence of fracture or dislocation. There is no evidence of arthropathy. Mild posterior calcaneal spurring is noted. Soft tissues are unremarkable. IMPRESSION: No acute abnormality seen in the left foot. Electronically Signed   By: Lupita Raider M.D.   On: 11/06/2019 09:08    Procedures Procedures (including critical care time)  Medications Ordered in UC Medications - No data to display  Initial Impression / Assessment and Plan / UC Course  I have reviewed the triage vital signs and the nursing notes.  Pertinent labs & imaging results that were available during my care of the patient were reviewed by me and considered in my medical decision making (see chart for details).     Reviewed imaging with pt No evidence of gout or infection on exam. Encouraged rest, ice, compression, elevation Pt has done well with Meloxicam in the past, will prescribe. Ace wrap applied for comfort F/u PCP 1-2 weeks if needed AVS given  Final Clinical Impressions(s) / UC Diagnoses   Final diagnoses:  Foot pain, left     Discharge Instructions      You may take 500mg  acetaminophen every 4-6 hours or in combination with the prescribed Mobic (Meloxicam) to help with pain and inflammation.  You may also use the ace wrap provided today, a cool compress and elevate your foot to help with  pain.  Call to schedule  a follow up appointment with your primary care provider later this week or next week if not improving.     ED Prescriptions    Medication Sig Dispense Auth. Provider   meloxicam (MOBIC) 15 MG tablet Take 1 tablet (15 mg total) by mouth daily. 30 tablet Lurene Shadow, [place]     I have reviewed the PDMP during this encounter.   Lurene Shadow, [place] 11/06/19 [phone]

## 2019-11-06 NOTE — Discharge Instructions (Signed)
  You may take 500mg  acetaminophen every 4-6 hours or in combination with the prescribed Mobic (Meloxicam) to help with pain and inflammation.  You may also use the ace wrap provided today, a cool compress and elevate your foot to help with pain.  Call to schedule a follow up appointment with your primary care provider later this week or next week if not improving.

## 2021-08-16 IMAGING — DX DG FOOT COMPLETE 3+V*L*
3 series · 3 of 3 positions shown · non-contrast
Comparison: None.

CLINICAL DATA: Acute left foot pain and swelling.

EXAM:
LEFT FOOT - COMPLETE 3+ VIEW

[foot ap]
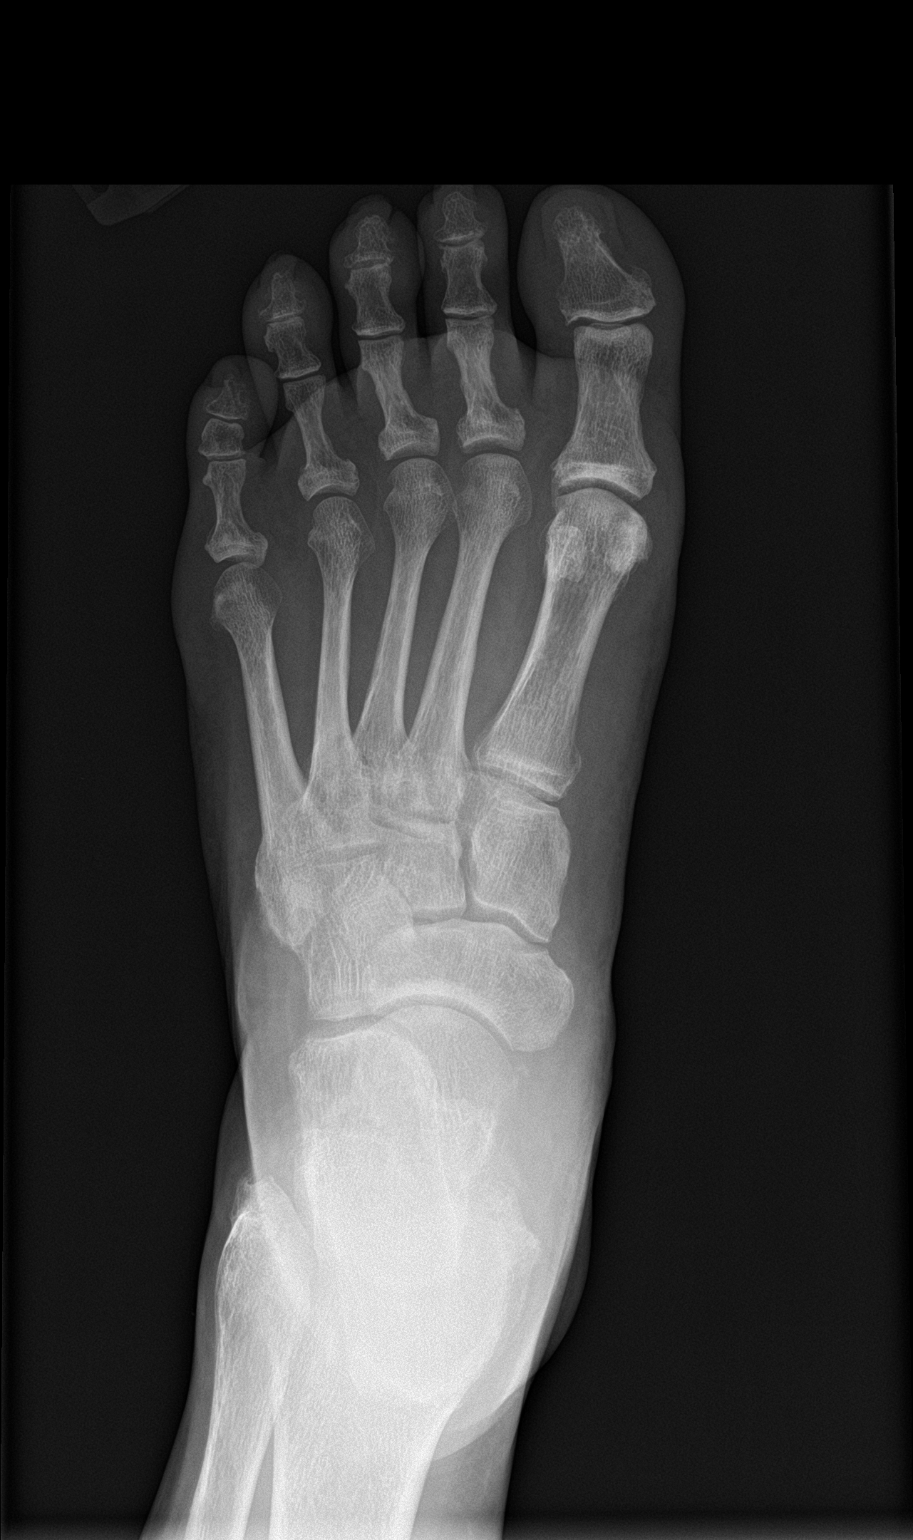

[foot obl]
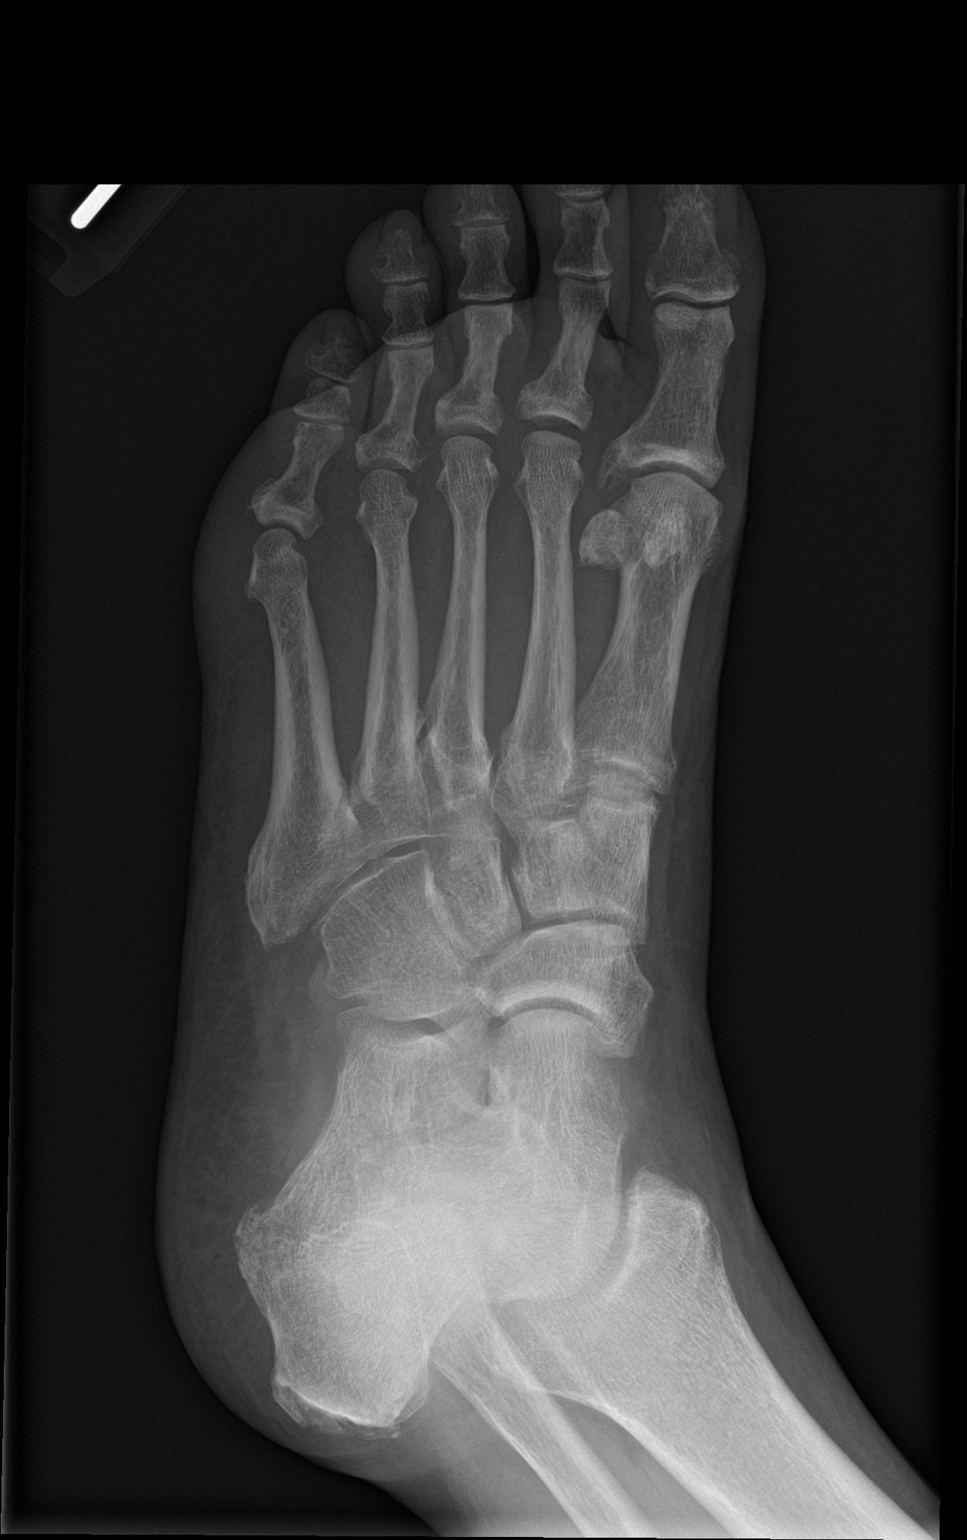

[foot lat]
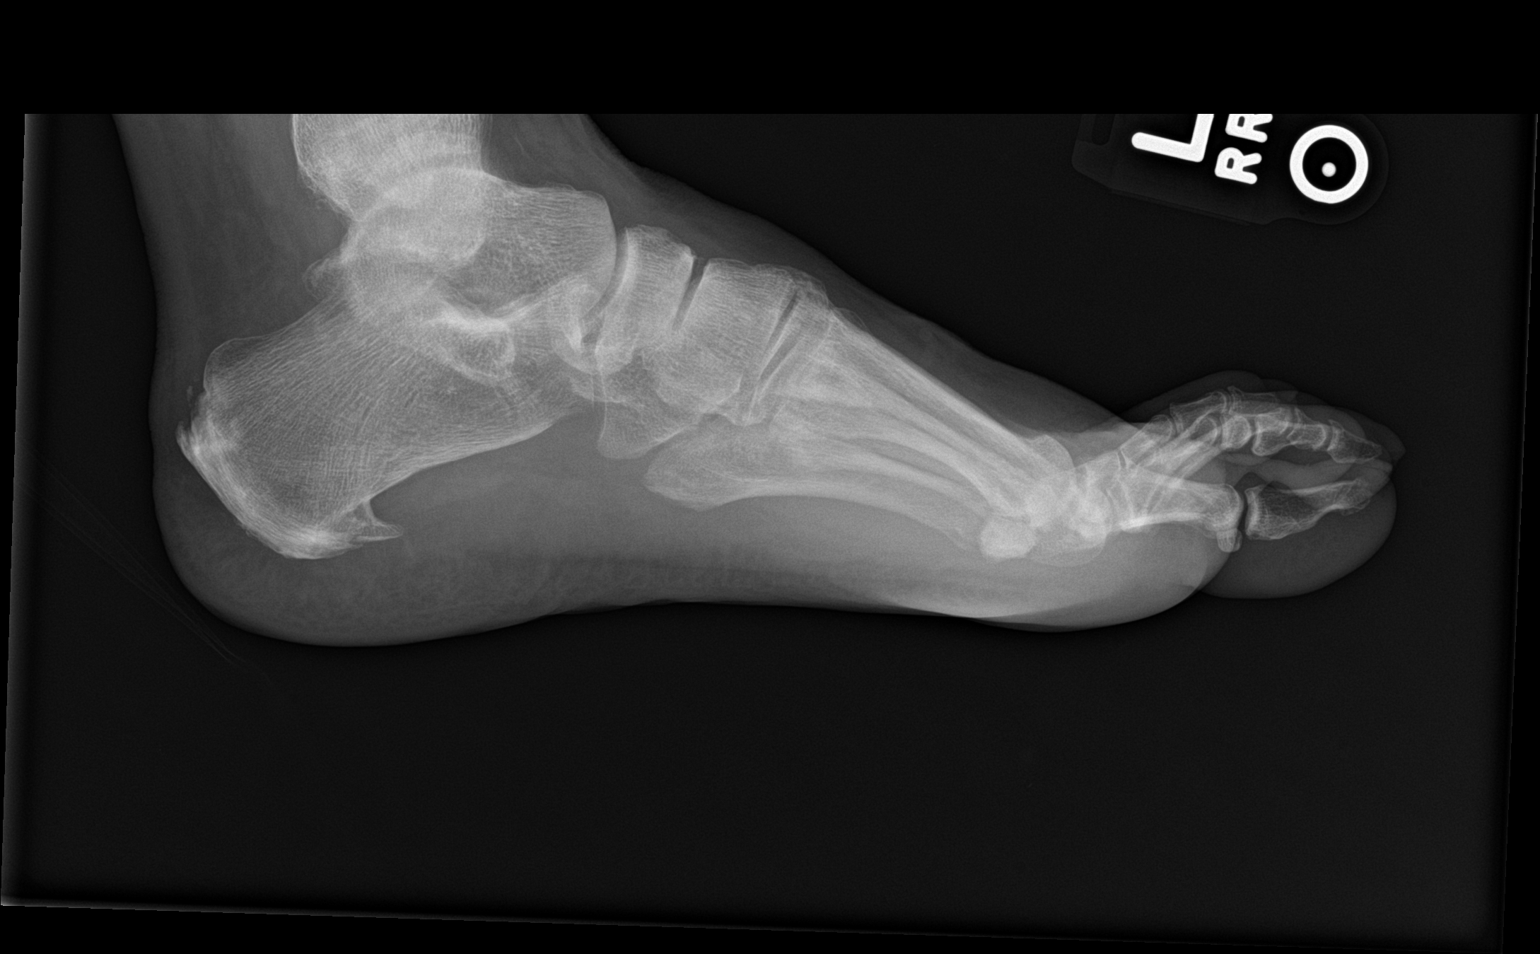

[3 of 3 positions shown; findings below may reference images not displayed]

FINDINGS: There is no evidence of fracture or dislocation. There is no
evidence of arthropathy. Mild posterior calcaneal spurring is noted.
Soft tissues are unremarkable.
IMPRESSION: No acute abnormality seen in the left foot.

## 2023-02-04 ENCOUNTER — Ambulatory Visit
Admission: RE | Admit: 2023-02-04 | Discharge: 2023-02-04 | Disposition: A | Discharge status: Home / Self Care | Payer: BC Managed Care – PPO | Source: Ambulatory Visit

## 2023-02-04 VITALS — BP 148/73 | HR 73 | Temp 97.7°F | Resp 17

## 2023-02-04 DIAGNOSIS — H532 Diplopia: Secondary | ICD-10-CM

## 2023-02-04 DIAGNOSIS — H538 Other visual disturbances: Secondary | ICD-10-CM | POA: Diagnosis not present

## 2023-02-04 NOTE — Discharge Instructions (Addendum)
Advised patient if vision disturbances continue specifically blurry vision or double vision of left eye please follow-up with local ophthalmologist/optometrist for further evaluation.  Contact information provided with his AVS this evening.

## 2023-02-04 NOTE — ED Triage Notes (Signed)
Pt c/o intermittent LT eye blurriness and double vision since yesterday. Says she got out of surgery when it occurred suddenly so she was taken down for CT and MRI which were both normal. Sxs continued today. Has tried to call eye doc and no availability for a month.

## 2023-02-04 NOTE — ED Provider Notes (Signed)
[facility]    CSN: [id number] Arrival date & time: 02/04/23  1703      History   Chief Complaint Chief Complaint  Patient presents with   Eye Problem    LT    HPI Mackenzie Wilson is a 67 y.o. female.   HPI pleasant 67 year old female presents with decreased visual acuity of left eye patient reports infrequent double vision and blurry vision of left eye.  Patient had left carotid artery endarterectomy yesterday.  Patient was evaluated for bilateral carotid artery stenosis and changes in visual acuity.  Patient reports was taken to CT and MRI of head following this procedure was told both eyes were within normal limits.  PMH significant for bilateral carotid stenosis, current cigarette smoker, and orthostatic lightheadedness.  Patient is accompanied by her mother this evening.  Patient is currently on Plavix and denies any unusual bleeding.  Past Medical History:  Diagnosis Date   History of prediabetes     Patient Active Problem List   Diagnosis Date Noted   Orthostatic lightheadedness 11/05/2016   Right wrist pain 11/05/2016   History of prediabetes 11/05/2016   Left knee pain 06/10/2015    Past Surgical History:  Procedure Laterality Date   ROTATOR CUFF REPAIR     TOTAL HIP ARTHROPLASTY     WRIST FRACTURE SURGERY      OB History   No obstetric history on file.      Home Medications    Prior to Admission medications   Medication Sig Start Date End Date Taking? Authorizing Provider  clopidogrel (PLAVIX) 75 MG tablet Take by mouth. 11/06/22 11/06/23 Yes [provider]  simvastatin (ZOCOR) 10 MG tablet Take 1 tablet by mouth at bedtime. 10/01/22  Yes [provider]  aspirin 81 MG chewable tablet 1 tab(s) chewed once a day    [provider]  Calcium Carb-Cholecalciferol (CALCIUM 1000 + D PO) Take by mouth.    [provider]  meloxicam (MOBIC) 15 MG tablet Take 1 tablet (15 mg total) by mouth daily. 11/06/19   Lurene Shadow, PA-C  traMADol (ULTRAM) 50 MG tablet Take 1 tablet (50 mg total) by mouth every 8 (eight) hours as needed. 06/10/15   Rodolph Bong, MD    Family History Family History  Problem Relation Age of Onset   Hypertension Mother    COPD Father     Social History Social History   Tobacco Use   Smoking status: Every Day    Current packs/day: 1.00    Types: Cigarettes   Smokeless tobacco: Never  Substance Use Topics   Alcohol use: Yes    Comment: 6 q wk   Drug use: No     Allergies   Varenicline   Review of Systems Review of Systems  Eyes:        Left eye blurriness and double vision since yesterday.     Physical Exam Triage Vital Signs ED Triage Vitals  Encounter Vitals Group     BP      Systolic BP Percentile      Diastolic BP Percentile      Pulse      Resp      Temp      Temp src      SpO2      Weight      Height      Head Circumference      Peak Flow      Pain Score  Pain Loc      Pain Education      Exclude from Growth Chart    No data found.  Updated Vital Signs BP (!) 148/73 (BP Location: Right Arm)   Pulse 73   Temp 97.7 F (36.5 C) (Oral)   Resp 17   SpO2 97%   Visual Acuity Right Eye Distance:   Left Eye Distance:   Bilateral Distance:    Right Eye Near:   Left Eye Near:    Bilateral Near:     Physical Exam Vitals and nursing note reviewed.  Constitutional:      General: She is not in acute distress.    Appearance: Normal appearance. She is normal weight. She is not ill-appearing.  HENT:     Head: Normocephalic and atraumatic.     Mouth/Throat:     Mouth: Mucous membranes are moist.     Pharynx: Oropharynx is clear.  Eyes:     Extraocular Movements: Extraocular movements intact.     Conjunctiva/sclera: Conjunctivae normal.     Pupils: Pupils are equal, round, and reactive to light.  Cardiovascular:     Rate and Rhythm: Normal rate and regular rhythm.     Pulses: Normal pulses.     Heart sounds: Normal heart  sounds.  Pulmonary:     Effort: Pulmonary effort is normal.     Breath sounds: Normal breath sounds. No wheezing, rhonchi or rales.  Musculoskeletal:        General: Normal range of motion.     Cervical back: Normal range of motion and neck supple.  Skin:    General: Skin is warm and dry.  Neurological:     General: No focal deficit present.     Mental Status: She is alert and oriented to person, place, and time. Mental status is at baseline.  Psychiatric:        Mood and Affect: Mood normal.        Behavior: Behavior normal.        Thought Content: Thought content normal.      UC Treatments / Results  Labs (all labs ordered are listed, but only abnormal results are displayed) Labs Reviewed - No data to display  EKG   Radiology No results found.  Procedures Procedures (including critical care time)  Medications Ordered in UC Medications - No data to display  Initial Impression / Assessment and Plan / UC Course  I have reviewed the triage vital signs and the nursing notes.  Pertinent labs & imaging results that were available during my care of the patient were reviewed by me and considered in my medical decision making (see chart for details).     MDM: 1.  Blurry vision, left eye-patient reports not currently however occurs intermittently and has several times today.  Advised patient to follow-up with local ophthalmologist/optometrist tomorrow, Friday, 02/04/2023 for further evaluation. 2.  Double vision-patient denies currently but reports has happened several times earlier today.  Encouraged patient to follow-up with local ophthalmologist/optometrist tomorrow, Friday, 02/04/2023 for further evaluation.  Patient agreed and verbalized understanding of these instructions and this plan of care today. Advised patient if vision disturbances continue specifically blurry vision or double vision of left eye please follow-up with local ophthalmologist/optometrist for further  evaluation.  Contact information provided with his AVS this evening.  Discharged home, hemodynamically stable. Final Clinical Impressions(s) / UC Diagnoses   Final diagnoses:  Blurry vision, left eye  Double vision     Discharge Instructions  Advised patient if vision disturbances continue specifically blurry vision or double vision of left eye please follow-up with local ophthalmologist/optometrist for further evaluation.  Contact information provided with his AVS this evening.     ED Prescriptions   None    PDMP not reviewed this encounter.   Trevor Iha, FNP 02/04/23 [phone]
